# Patient Record
Sex: Female | Born: 1950 | Race: White | Hispanic: No | Marital: Married | Smoking: Never smoker
Health system: Southern US, Community
[De-identification: ages and names within clinical notes are randomized; demographics above are authoritative.]

## PROBLEM LIST (undated history)

## (undated) DIAGNOSIS — C801 Malignant (primary) neoplasm, unspecified: Secondary | ICD-10-CM

## (undated) DIAGNOSIS — R1310 Dysphagia, unspecified: Secondary | ICD-10-CM

## (undated) DIAGNOSIS — N200 Calculus of kidney: Secondary | ICD-10-CM

## (undated) DIAGNOSIS — D0510 Intraductal carcinoma in situ of unspecified breast: Secondary | ICD-10-CM

## (undated) HISTORY — PX: LITHOTRIPSY: SUR834

## (undated) HISTORY — PX: TONSILLECTOMY: SUR1361

## (undated) HISTORY — PX: MASTECTOMY: SHX3

---

## 1990-08-17 HISTORY — PX: BREAST SURGERY: SHX581

## 1998-03-01 ENCOUNTER — Other Ambulatory Visit: Admission: RE | Admit: 1998-03-01 | Discharge: 1998-03-01 | Payer: Self-pay | Admitting: *Deleted

## 1999-06-03 ENCOUNTER — Other Ambulatory Visit: Admission: RE | Admit: 1999-06-03 | Discharge: 1999-06-03 | Payer: Self-pay | Admitting: *Deleted

## 2001-04-07 ENCOUNTER — Other Ambulatory Visit: Admission: RE | Admit: 2001-04-07 | Discharge: 2001-04-07 | Payer: Self-pay | Admitting: *Deleted

## 2003-05-24 ENCOUNTER — Other Ambulatory Visit: Admission: RE | Admit: 2003-05-24 | Discharge: 2003-05-24 | Payer: Self-pay | Admitting: Obstetrics & Gynecology

## 2003-06-14 HISTORY — PX: BREAST RECONSTRUCTION: SHX9

## 2005-04-23 ENCOUNTER — Ambulatory Visit (HOSPITAL_COMMUNITY): Admission: RE | Admit: 2005-04-23 | Discharge: 2005-04-23 | Payer: Self-pay | Admitting: *Deleted

## 2005-10-14 ENCOUNTER — Ambulatory Visit: Payer: Self-pay | Admitting: Unknown Physician Specialty

## 2005-10-14 HISTORY — PX: COLONOSCOPY: SHX174

## 2007-01-02 ENCOUNTER — Emergency Department: Payer: Self-pay | Admitting: Emergency Medicine

## 2014-04-25 DIAGNOSIS — D0510 Intraductal carcinoma in situ of unspecified breast: Secondary | ICD-10-CM

## 2014-04-25 HISTORY — DX: Intraductal carcinoma in situ of unspecified breast: D05.10

## 2015-10-10 ENCOUNTER — Emergency Department
Admission: EM | Admit: 2015-10-10 | Discharge: 2015-10-10 | Disposition: A | Discharge status: Home / Self Care | Payer: Managed Care, Other (non HMO) | Attending: Emergency Medicine | Admitting: Emergency Medicine

## 2015-10-10 ENCOUNTER — Telehealth: Payer: Self-pay | Admitting: Emergency Medicine

## 2015-10-10 DIAGNOSIS — R197 Diarrhea, unspecified: Secondary | ICD-10-CM | POA: Diagnosis not present

## 2015-10-10 DIAGNOSIS — R112 Nausea with vomiting, unspecified: Secondary | ICD-10-CM | POA: Insufficient documentation

## 2015-10-10 LAB — CBC
HCT: 42.3 % (ref 35.0–47.0)
HEMOGLOBIN: 14.5 g/dL (ref 12.0–16.0)
MCH: 32.3 pg (ref 26.0–34.0)
MCHC: 34.2 g/dL (ref 32.0–36.0)
MCV: 94.2 fL (ref 80.0–100.0)
PLATELETS: 198 10*3/uL (ref 150–440)
RBC: 4.49 MIL/uL (ref 3.80–5.20)
RDW: 13 % (ref 11.5–14.5)
WBC: 9.5 10*3/uL (ref 3.6–11.0)

## 2015-10-10 LAB — COMPREHENSIVE METABOLIC PANEL
ALBUMIN: 4.9 g/dL (ref 3.5–5.0)
ALK PHOS: 82 U/L (ref 38–126)
ALT: 19 U/L (ref 14–54)
ANION GAP: 12 (ref 5–15)
AST: 24 U/L (ref 15–41)
BUN: 19 mg/dL (ref 6–20)
CALCIUM: 9.6 mg/dL (ref 8.9–10.3)
CHLORIDE: 105 mmol/L (ref 101–111)
CO2: 23 mmol/L (ref 22–32)
Creatinine, Ser: 0.77 mg/dL (ref 0.44–1.00)
GFR calc non Af Amer: >60 mL/min (ref >60)
GLUCOSE: 174 mg/dL — AB (ref 65–99)
POTASSIUM: 3.9 mmol/L (ref 3.5–5.1)
SODIUM: 140 mmol/L (ref 135–145)
Total Bilirubin: 1.1 mg/dL (ref 0.3–1.2)
Total Protein: 7.8 g/dL (ref 6.5–8.1)

## 2015-10-10 LAB — LIPASE, BLOOD: Lipase: 26 U/L (ref 11–51)

## 2015-10-10 NOTE — ED Notes (Signed)
PT in with co n/v/d since 2300 denies any abd pain.

## 2015-10-10 NOTE — ED Notes (Signed)
Called patient due to lwot to inquire about condition and follow up plans. Left message.   

## 2015-10-15 ENCOUNTER — Other Ambulatory Visit: Payer: Self-pay | Admitting: Nurse Practitioner

## 2015-10-15 DIAGNOSIS — R1310 Dysphagia, unspecified: Secondary | ICD-10-CM

## 2015-10-25 ENCOUNTER — Ambulatory Visit: Payer: Managed Care, Other (non HMO)

## 2015-11-07 ENCOUNTER — Ambulatory Visit
Admission: RE | Admit: 2015-11-07 | Discharge: 2015-11-07 | Disposition: A | Discharge status: Home / Self Care | Payer: Managed Care, Other (non HMO) | Source: Ambulatory Visit | Attending: Nurse Practitioner | Admitting: Nurse Practitioner

## 2015-11-07 DIAGNOSIS — R1310 Dysphagia, unspecified: Secondary | ICD-10-CM | POA: Diagnosis present

## 2015-11-07 NOTE — Therapy (Addendum)
[place], [place], [address] Phone: [phone]   Fax:     Modified Barium Swallow  Patient Details  Name: Laura Lester MRN: [id number] Date of Birth: [birth year] No Data Recorded  Encounter Date: 11/07/2015   Subjective: Patient behavior: (alertness, ability to follow instructions, etc.): pt A/O x3; verbally conversive and pleasant. Noted inconsistent throat clearing prior to study; clear vocal quality and adequate volume of speech.  Chief complaint: Dysphagia. Pt endorsed feelings of difficulty initiating the swallow at times; noted more significant issues w/ meats, some breads. Pt stated she has had this "feeling" for "years"; noted a Barium study in 2006 in which this was noted as well per her chart history. Pt stated she feels it necessary to chew the meats/foods well in order to be able to swallow them. She denies any s/s of Reflux and is not on any PPI per her report.    Objective:  Radiological Procedure: A videoflouroscopic evaluation of oral-preparatory, reflex initiation, and pharyngeal phases of the swallow was performed; as well as a screening of the upper esophageal phase.  I. POSTURE: upright II. VIEW: A-P III. COMPENSATORY STRATEGIES: moistened the cracker IV. BOLUSES ADMINISTERED:  Thin Liquid: 6 trials via cup  Nectar-thick Liquid: n/a  Honey-thick Liquid: n/a  Puree: 3 trials  Mechanical Soft: 3 trials V. RESULTS OF EVALUATION: A. ORAL PREPARATORY PHASE: (The lips, tongue, and velum are observed for strength and coordination)       **Overall Severity Rating: WFL. Pt exhibited appropriate oral control for bolus mastication/management and transfer for swallowing w/ consistencies tested(meats were unable to be assessed during this type of study). Native dentition.   B. SWALLOW INITIATION/REFLEX: (The reflex is normal if "triggered" by the time the bolus reached the base of the  tongue)  **Overall Severity Rating: [facility] Pt exhibited adequate timing of the pharyngeal swallow; trials moved over the base of tongue to the area where the valleculae is located - however, view of the valleculae and epiglottis was obscured d/t what appeared to be a mass effect on the anterior portion of the Hypopharynx.(This was confirmed by the Radiologist)   C. PHARYNGEAL PHASE: (Pharyngeal function is normal if the bolus shows rapid, smooth, and continuous transit through the pharynx and there is no pharyngeal residue after the swallow)  **Overall Severity Rating: [facility]. Pt exhibited fairly adequate pharyngeal phase of swallowing w/ only slight pharyngeal residue occuring moreso in the pyriform sinuses intermittently. This was cleared completely by an involuntary, f/u swallow by pt(no cues). This pharyngeal residue again was slight, and inconsistent; no buildup was noted as trials continued.    D. LARYNGEAL PENETRATION: (Material entering into the laryngeal inlet/vestibule but not aspirated): NONE E. ASPIRATION: NONE F. ESOPHAGEAL PHASE: (Screening of the upper esophagus): Esophageal dysmotility was noted in the mid-lower Esophagus as bolus material(food) traveled distally w/ a min. degree of retrograde activity noted in the mid Esophagus. Time post trial appeared to aid motility/clearing.   ASSESSMENT: Pt presented w/ adequate oropharyngeal phase function of swallowing w/ no laryngeal penetration or aspiration occuring during this study. Oral phase of swallowing was [facility] w/ all trial consistencies. Timing of the pharyngeal swallow was [place] [facility] during the Pharyngeal phase w/ all trial consistencies. Pt exhibited initiation of the pharyngeal swallow as bolus material moved over the base of tongue to the area where the Valleculae is located. However, view of the Valleculae and Epiglottis was obscured by what appeared to be a mass  effect on the Anterior portion of the Hypopharynx. This was confirmed by the  Radiologist present who then recommended a CT scan of this area to better assess. Pt utilized a f/u (independent) swallow during the Pharyngeal phase to clear any slight bolus residue; this was cleared completely w/ no buildup of residue noted. Post study, pt was noted to demonstrate fairly chronic throat clearing; unsure if related to increased sensation(from the suspect mass effect) in the Hypopharynx or any mild Esophageal dysmotility. Pt stated she was not fully aware of the consistency of the throat clearing herself. With any chronic throat clearing, f/u w/ ENT for further assessment is recommended.   PLAN/RECOMMENDATIONS:  A. Diet: regular; cut meats/foods moistened well  B. Swallowing Precautions: general aspiration and Reflux precautions as discussed  C. Recommended consultation to: Primary MD for discussion of CT of head/neck area d/t findings on MBSS and per recommendation of Radiologist; ENT (throat clearing); GI (potential f/u w/ EGD to r/o any Esophageal dysmotility)  D. Therapy recommendations: no f/u w/ skilled ST services indicated at this time  E. Results and recommendations were discussed w/ pt and agreed upon; results faxed to MD.      End of Session - 2015-12-02 1602    Visit Number 1   Number of Visits 1   Date for SLP Re-Evaluation Dec 02, 2015   SLP Start Time 1320   SLP Stop Time  1420   SLP Time Calculation (min) 60 min   Activity Tolerance Patient tolerated treatment well      PMH: Breast Cancer w/ Mastectomy, per pt report. Pt denied any other Neurological history or significant medical history.   There were no vitals filed for this visit.  Visit Diagnosis: Dysphagia - Plan: DG SWALLOWING FUNC-SPEECH PATHOLOGY, DG SWALLOWING FUNC-SPEECH PATHOLOGY                  G-Codes - 02-Dec-2015 12/11/00    Functional Assessment Tool Used clinical judgement   Functional Limitations Swallowing   Swallow Current Status [id number]) At least 1 percent but less than 20 percent  impaired, limited or restricted   Swallow Goal Status [id number]) At least 1 percent but less than 20 percent impaired, limited or restricted   Swallow Discharge Status [id number]) At least 1 percent but less than 20 percent impaired, limited or restricted            Orinda Kenner, [place], CCC-SLP  Chardai Gangemi 12-02-15, 4:04 PM  [place], [place], [address] Phone: [phone]   Fax:     Name: Laura Lester MRN: [id number] Date of Birth: 1951-06-03

## 2015-11-08 ENCOUNTER — Other Ambulatory Visit: Payer: Self-pay | Admitting: Internal Medicine

## 2015-11-08 DIAGNOSIS — J392 Other diseases of pharynx: Secondary | ICD-10-CM

## 2015-11-13 ENCOUNTER — Ambulatory Visit: Payer: Managed Care, Other (non HMO)

## 2015-11-20 ENCOUNTER — Encounter: Payer: Self-pay | Admitting: *Deleted

## 2015-11-21 ENCOUNTER — Ambulatory Visit: Payer: Managed Care, Other (non HMO) | Admitting: Anesthesiology

## 2015-11-21 ENCOUNTER — Encounter
Admission: RE | Disposition: A | Discharge status: Home / Self Care | Payer: Self-pay | Source: Ambulatory Visit | Attending: Unknown Physician Specialty

## 2015-11-21 ENCOUNTER — Ambulatory Visit
Admission: RE | Admit: 2015-11-21 | Discharge: 2015-11-21 | Disposition: A | Discharge status: Home / Self Care | Payer: Managed Care, Other (non HMO) | Source: Ambulatory Visit | Attending: Unknown Physician Specialty | Admitting: Unknown Physician Specialty

## 2015-11-21 ENCOUNTER — Encounter: Payer: Self-pay | Admitting: Anesthesiology

## 2015-11-21 DIAGNOSIS — Z9010 Acquired absence of unspecified breast and nipple: Secondary | ICD-10-CM | POA: Diagnosis not present

## 2015-11-21 DIAGNOSIS — Z8371 Family history of colonic polyps: Secondary | ICD-10-CM | POA: Diagnosis not present

## 2015-11-21 DIAGNOSIS — K640 First degree hemorrhoids: Secondary | ICD-10-CM | POA: Insufficient documentation

## 2015-11-21 DIAGNOSIS — Z9889 Other specified postprocedural states: Secondary | ICD-10-CM | POA: Insufficient documentation

## 2015-11-21 DIAGNOSIS — Z853 Personal history of malignant neoplasm of breast: Secondary | ICD-10-CM | POA: Insufficient documentation

## 2015-11-21 DIAGNOSIS — Z79899 Other long term (current) drug therapy: Secondary | ICD-10-CM | POA: Insufficient documentation

## 2015-11-21 DIAGNOSIS — Z87442 Personal history of urinary calculi: Secondary | ICD-10-CM | POA: Insufficient documentation

## 2015-11-21 DIAGNOSIS — Z880 Allergy status to penicillin: Secondary | ICD-10-CM | POA: Diagnosis not present

## 2015-11-21 DIAGNOSIS — D125 Benign neoplasm of sigmoid colon: Secondary | ICD-10-CM | POA: Diagnosis not present

## 2015-11-21 DIAGNOSIS — Z1211 Encounter for screening for malignant neoplasm of colon: Secondary | ICD-10-CM | POA: Diagnosis present

## 2015-11-21 HISTORY — PX: COLONOSCOPY WITH PROPOFOL: SHX5780

## 2015-11-21 HISTORY — DX: Dysphagia, unspecified: R13.10

## 2015-11-21 HISTORY — DX: Calculus of kidney: N20.0

## 2015-11-21 HISTORY — DX: Intraductal carcinoma in situ of unspecified breast: D05.10

## 2015-11-21 SURGERY — COLONOSCOPY WITH PROPOFOL
Anesthesia: General

## 2015-11-21 MED ORDER — EPHEDRINE SULFATE 50 MG/ML IJ SOLN
INTRAMUSCULAR | Status: DC | PRN
  Administered 2015-11-21: 5 mg via INTRAVENOUS
  Administered 2015-11-21 (×2): 10 mg via INTRAVENOUS

## 2015-11-21 MED ORDER — LIDOCAINE HCL (PF) 2 % IJ SOLN
INTRAMUSCULAR | Status: DC | PRN
  Administered 2015-11-21: 60 mg

## 2015-11-21 MED ORDER — SODIUM CHLORIDE 0.9 % IV SOLN
INTRAVENOUS | Status: DC
  Administered 2015-11-21: 1000 mL via INTRAVENOUS

## 2015-11-21 MED ORDER — SODIUM CHLORIDE 0.9 % IV SOLN: INTRAVENOUS | Status: DC

## 2015-11-21 MED ORDER — PROPOFOL 500 MG/50ML IV EMUL
INTRAVENOUS | Status: DC | PRN
  Administered 2015-11-21: 100 ug/kg/min via INTRAVENOUS

## 2015-11-21 MED ORDER — FENTANYL CITRATE (PF) 100 MCG/2ML IJ SOLN
INTRAMUSCULAR | Status: DC | PRN
  Administered 2015-11-21: 50 ug via INTRAVENOUS

## 2015-11-21 MED ORDER — PROPOFOL 10 MG/ML IV BOLUS
INTRAVENOUS | Status: DC | PRN
  Administered 2015-11-21: 10 mg via INTRAVENOUS
  Administered 2015-11-21: 20 mg via INTRAVENOUS

## 2015-11-21 MED ORDER — MIDAZOLAM HCL 5 MG/5ML IJ SOLN
INTRAMUSCULAR | Status: DC | PRN
  Administered 2015-11-21: 1 mg via INTRAVENOUS

## 2015-11-21 NOTE — Anesthesia Preprocedure Evaluation (Signed)
Anesthesia Evaluation  Patient identified by MRN, date of birth, ID band Patient awake    Reviewed: Allergy & Precautions, H&P , NPO status , Patient's Chart, lab work & pertinent test results, reviewed documented beta blocker date and time   History of Anesthesia Complications Negative for: history of anesthetic complications  Airway Mallampati: II  TM Distance: >3 FB Neck ROM: full    Dental no notable dental hx. (+) Teeth Intact   Pulmonary neg pulmonary ROS,    Pulmonary exam normal breath sounds clear to auscultation       Cardiovascular Exercise Tolerance: Good negative cardio ROS Normal cardiovascular exam Rhythm:regular Rate:Normal     Neuro/Psych negative neurological ROS  negative psych ROS   GI/Hepatic negative GI ROS, Neg liver ROS,   Endo/Other  negative endocrine ROS  Renal/GU Renal disease (kidney stones)  negative genitourinary   Musculoskeletal   Abdominal   Peds  Hematology negative hematology ROS (+)   Anesthesia Other Findings Past Medical History:   Ductal carcinoma in situ (DCIS) of breast       04/25/2014     Dysphagia                                                    Nephrolithiasis                                              Reproductive/Obstetrics negative OB ROS                             Anesthesia Physical Anesthesia Plan  ASA: I  Anesthesia Plan: General   Post-op Pain Management:    Induction:   Airway Management Planned:   Additional Equipment:   Intra-op Plan:   Post-operative Plan:   Informed Consent: I have reviewed the patients History and Physical, chart, labs and discussed the procedure including the risks, benefits and alternatives for the proposed anesthesia with the patient or authorized representative who has indicated his/her understanding and acceptance.   Dental Advisory Given  Plan Discussed with: Anesthesiologist, CRNA  and Surgeon  Anesthesia Plan Comments:         Anesthesia Quick Evaluation

## 2015-11-21 NOTE — Op Note (Signed)
[facility] Gastroenterology Patient Name: Laura Lester Procedure Date: 11/21/2015 10:30 AM MRN: [id number] Account #: [id number] Date of Birth: 12/01/1950 Admit Type: Outpatient Age: 65 Room: [facility] ENDO ROOM 1 Gender: Female Note Status: Finalized Procedure:            Colonoscopy Indications:          Colon cancer screening in patient at increased risk:                        Family history of 1st-degree relative with colon polyps Providers:            Manya Silvas, MD Referring MD:         Rusty Aus, MD (Referring MD) Medicines:            Propofol per Anesthesia Complications:        No immediate complications. Procedure:            Pre-Anesthesia Assessment:                       - After reviewing the risks and benefits, the patient                        was deemed in satisfactory condition to undergo the                        procedure.                       After obtaining informed consent, the colonoscope was                        passed under direct vision. Throughout the procedure,                        the patient's blood pressure, pulse, and oxygen                        saturations were monitored continuously. The                        Colonoscope was introduced through the anus and                        advanced to the the cecum, identified by appendiceal                        orifice and ileocecal valve. The colonoscopy was                        somewhat difficult due to significant looping.                        Successful completion of the procedure was aided by                        applying abdominal pressure. The patient tolerated the                        procedure well. The quality of the bowel preparation  was good. Findings:      A small polyp was found in the descending colon. The polyp was sessile.       The polyp was removed with a hot snare. Resection and retrieval were       complete.  Internal hemorrhoids were found during endoscopy. The hemorrhoids were       small and Grade I (internal hemorrhoids that do not prolapse).      The exam was otherwise without abnormality. Impression:           - One small polyp in the descending colon, removed with                        a hot snare. Resected and retrieved.                       - Internal hemorrhoids.                       - The examination was otherwise normal. Recommendation:       - Await pathology results. Manya Silvas, MD 11/21/2015 11:08:09 AM This report has been signed electronically. Number of Addenda: 0 Note Initiated On: 11/21/2015 10:30 AM Scope Withdrawal Time: 0 hours 12 minutes 27 seconds  Total Procedure Duration: 0 hours 26 minutes 47 seconds       [facility]

## 2015-11-21 NOTE — Anesthesia Postprocedure Evaluation (Signed)
Anesthesia Post Note  Patient: Laura Lester  Procedure(s) Performed: Procedure(s) (LRB): COLONOSCOPY WITH PROPOFOL (N/A)  Patient location during evaluation: Endoscopy Anesthesia Type: General Level of consciousness: awake and alert Pain management: pain level controlled Vital Signs Assessment: post-procedure vital signs reviewed and stable Respiratory status: spontaneous breathing, nonlabored ventilation, respiratory function stable and patient connected to nasal cannula oxygen Cardiovascular status: blood pressure returned to baseline and stable Postop Assessment: no signs of nausea or vomiting Anesthetic complications: no    Last Vitals:  Filed Vitals:   11/21/15 1130 11/21/15 1140  BP: 126/67 141/77  Pulse: 59 67  Temp:    Resp: 19 16    Last Pain: There were no vitals filed for this visit.               Martha Clan

## 2015-11-21 NOTE — H&P (Signed)
   Primary Care Physician:  Rusty Aus, MD Primary Gastroenterologist:  Dr. Vira Agar  Pre-Procedure History & Physical: HPI:  Laura Lester is a 65 y.o. female is here for an colonoscopy.   Past Medical History  Diagnosis Date  . 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 Dysphagia   . Nephrolithiasis     Past Surgical History  Procedure Laterality Date  . Cesarean section    . Breast surgery  1992    Mastectomy  . Lithotripsy    . Colonoscopy  10/14/2005  . Breast reconstruction Right 06/14/2003    Reconstruction breast delayed post mastopexy w/implant    Prior to Admission medications   Medication Sig Start Date End Date Taking? Authorizing Provider  cloNIDine (CATAPRES) 0.1 MG tablet Take 0.1 mg by mouth at bedtime as needed (Used for difficulty staying asleep).   Yes Historical Provider, MD  valACYclovir (VALTREX) 1000 MG tablet Take 1,000 mg by mouth 2 (two) times daily. Reported on 11/21/2015    Historical Provider, MD    Allergies as of 11/14/2015 - Review Complete 10/10/2015  Allergen Reaction Noted  . Penicillins Hives 10/10/2015    History reviewed. No pertinent family history.  Social History   Social History  . Marital Status: Married    Spouse Name: N/A  . Number of Children: N/A  . Years of Education: N/A   Occupational History  . Not on file.   Social History Main Topics  . Smoking status: Never Smoker   . Smokeless tobacco: Not on file  . Alcohol Use: No  . Drug Use: No  . Sexual Activity: Not on file   Other Topics Concern  . Not on file   Social History Narrative    Review of Systems: See HPI, otherwise negative ROS  Physical Exam: BP 127/70 mmHg  Pulse 63  Temp(Src) 96.6 F (35.9 C) (Tympanic)  Resp 16  Ht 5' 6.5" (1.689 m)  Wt 63.504 kg (140 lb)  BMI 22.26 kg/m2  SpO2 100% General:   Alert,  pleasant and cooperative in NAD Head:  Normocephalic and atraumatic. Neck:  Supple; no masses or thyromegaly. Lungs:   Clear throughout to auscultation.    Heart:  Regular rate and rhythm. Abdomen:  Soft, nontender and nondistended. Normal bowel sounds, without guarding, and without rebound.   Neurologic:  Alert and  oriented x4;  grossly normal neurologically.  Impression/Plan: [name] is here for an colonoscopy to be performed for FH colon polyps  Risks, benefits, limitations, and alternatives regarding  colonoscopy have been reviewed with the patient.  Questions have been answered.  All parties agreeable.   Gaylyn Cheers, MD  11/21/2015, 10:30 AM

## 2015-11-21 NOTE — Transfer of Care (Signed)
Immediate Anesthesia Transfer of Care Note  Patient: Laura Lester  Procedure(s) Performed: Procedure(s): COLONOSCOPY WITH PROPOFOL (N/A)  Patient Location: PACU  Anesthesia Type:General  Level of Consciousness: sedated  Airway & Oxygen Therapy: Patient Spontanous Breathing and Patient connected to nasal cannula oxygen  Post-op Assessment: Report given to RN and Post -op Vital signs reviewed and stable  Post vital signs: Reviewed and stable  Last Vitals:  Filed Vitals:   11/21/15 0952  BP: 127/70  Pulse: 63  Temp: 35.9 C  Resp: 16    Complications: No apparent anesthesia complications

## 2015-11-22 LAB — SURGICAL PATHOLOGY

## 2015-11-26 ENCOUNTER — Encounter: Payer: Self-pay | Admitting: Unknown Physician Specialty

## 2015-12-10 ENCOUNTER — Encounter: Payer: Self-pay | Admitting: *Deleted

## 2015-12-11 ENCOUNTER — Ambulatory Visit: Payer: Managed Care, Other (non HMO) | Admitting: Certified Registered Nurse Anesthetist

## 2015-12-11 ENCOUNTER — Encounter
Admission: RE | Disposition: A | Discharge status: Home / Self Care | Payer: Self-pay | Source: Ambulatory Visit | Attending: Unknown Physician Specialty

## 2015-12-11 ENCOUNTER — Ambulatory Visit
Admission: RE | Admit: 2015-12-11 | Discharge: 2015-12-11 | Disposition: A | Discharge status: Home / Self Care | Payer: Managed Care, Other (non HMO) | Source: Ambulatory Visit | Attending: Unknown Physician Specialty | Admitting: Unknown Physician Specialty

## 2015-12-11 ENCOUNTER — Encounter: Payer: Self-pay | Admitting: *Deleted

## 2015-12-11 DIAGNOSIS — Z79899 Other long term (current) drug therapy: Secondary | ICD-10-CM | POA: Diagnosis not present

## 2015-12-11 DIAGNOSIS — Z87442 Personal history of urinary calculi: Secondary | ICD-10-CM | POA: Diagnosis not present

## 2015-12-11 DIAGNOSIS — Z9889 Other specified postprocedural states: Secondary | ICD-10-CM | POA: Diagnosis not present

## 2015-12-11 DIAGNOSIS — Z880 Allergy status to penicillin: Secondary | ICD-10-CM | POA: Diagnosis not present

## 2015-12-11 DIAGNOSIS — R1310 Dysphagia, unspecified: Secondary | ICD-10-CM | POA: Insufficient documentation

## 2015-12-11 DIAGNOSIS — K2970 Gastritis, unspecified, without bleeding: Secondary | ICD-10-CM | POA: Diagnosis not present

## 2015-12-11 DIAGNOSIS — Z853 Personal history of malignant neoplasm of breast: Secondary | ICD-10-CM | POA: Diagnosis not present

## 2015-12-11 DIAGNOSIS — J392 Other diseases of pharynx: Secondary | ICD-10-CM | POA: Diagnosis present

## 2015-12-11 DIAGNOSIS — Z9010 Acquired absence of unspecified breast and nipple: Secondary | ICD-10-CM | POA: Diagnosis not present

## 2015-12-11 HISTORY — PX: ESOPHAGOGASTRODUODENOSCOPY (EGD) WITH PROPOFOL: SHX5813

## 2015-12-11 HISTORY — PX: BALLOON DILATION: SHX5330

## 2015-12-11 HISTORY — DX: Malignant (primary) neoplasm, unspecified: C80.1

## 2015-12-11 SURGERY — ESOPHAGOGASTRODUODENOSCOPY (EGD) WITH PROPOFOL
Anesthesia: General

## 2015-12-11 MED ORDER — GLYCOPYRROLATE 0.2 MG/ML IJ SOLN
INTRAMUSCULAR | Status: DC | PRN
  Administered 2015-12-11: 0.2 mg via INTRAVENOUS

## 2015-12-11 MED ORDER — PROPOFOL 10 MG/ML IV BOLUS
INTRAVENOUS | Status: DC | PRN
  Administered 2015-12-11: 20 mg via INTRAVENOUS
  Administered 2015-12-11: 30 mg via INTRAVENOUS
  Administered 2015-12-11: 50 mg via INTRAVENOUS

## 2015-12-11 MED ORDER — LIDOCAINE HCL (CARDIAC) 20 MG/ML IV SOLN
INTRAVENOUS | Status: DC | PRN
  Administered 2015-12-11: 100 mg via INTRAVENOUS

## 2015-12-11 MED ORDER — BUTAMBEN-TETRACAINE-BENZOCAINE 2-2-14 % EX AERO
INHALATION_SPRAY | CUTANEOUS | Status: DC | PRN
  Administered 2015-12-11: 1 via TOPICAL

## 2015-12-11 MED ORDER — FENTANYL CITRATE (PF) 100 MCG/2ML IJ SOLN
INTRAMUSCULAR | Status: DC | PRN
Start: 2015-12-11 — End: 2015-12-11
  Administered 2015-12-11: 50 ug via INTRAVENOUS

## 2015-12-11 MED ORDER — PROPOFOL 500 MG/50ML IV EMUL
INTRAVENOUS | Status: DC | PRN
  Administered 2015-12-11: 140 ug/kg/min via INTRAVENOUS

## 2015-12-11 MED ORDER — SODIUM CHLORIDE 0.9 % IV SOLN
INTRAVENOUS | Status: DC
  Administered 2015-12-11: 1000 mL via INTRAVENOUS

## 2015-12-11 MED ORDER — SODIUM CHLORIDE 0.9 % IV SOLN: INTRAVENOUS | Status: DC

## 2015-12-11 NOTE — Anesthesia Procedure Notes (Signed)
Performed by: Jacy Brocker Pre-anesthesia Checklist: Emergency Drugs available, Patient identified, Suction available, Patient being monitored and Timeout performed Patient Re-evaluated:Patient Re-evaluated prior to inductionOxygen Delivery Method: Nasal cannula Intubation Type: IV induction       

## 2015-12-11 NOTE — Anesthesia Preprocedure Evaluation (Signed)
Anesthesia Evaluation  Patient identified by MRN, date of birth, ID band Patient awake    Reviewed: Allergy & Precautions, NPO status , Patient's Chart, lab work & pertinent test results, reviewed documented beta blocker date and time   Airway Mallampati: II  TM Distance: >3 FB     Dental  (+) Chipped   Pulmonary          Cardiovascular hypertension, Pt. on medications     Neuro/Psych    GI/Hepatic   Endo/Other    Renal/GU Renal InsufficiencyRenal disease     Musculoskeletal   Abdominal   Peds  Hematology   Anesthesia Other Findings   Reproductive/Obstetrics                             Anesthesia Physical Anesthesia Plan  ASA: II  Anesthesia Plan: General   Post-op Pain Management:    Induction: Intravenous  Airway Management Planned: Nasal Cannula  Additional Equipment:   Intra-op Plan:   Post-operative Plan:   Informed Consent: I have reviewed the patients History and Physical, chart, labs and discussed the procedure including the risks, benefits and alternatives for the proposed anesthesia with the patient or authorized representative who has indicated his/her understanding and acceptance.     Plan Discussed with: CRNA  Anesthesia Plan Comments:         Anesthesia Quick Evaluation  

## 2015-12-11 NOTE — H&P (Signed)
   Primary Care Physician:  Rusty Aus, MD Primary Gastroenterologist:  Dr. Vira Agar  Pre-Procedure History & Physical: HPI:  Laura Lester is a 65 y.o. female is here for an endoscopy.   Past Medical History  Diagnosis Date  . 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 Dysphagia   . Cancer ([place])     Ductal carcinoma in situ of breast  . Nephrolithiasis   . Nephrolithiasis     Past Surgical History  Procedure Laterality Date  . Cesarean section    . Breast surgery  1992    Mastectomy  . Lithotripsy    . Colonoscopy  10/14/2005  . Breast reconstruction Right 06/14/2003    Reconstruction breast delayed post mastopexy w/implant  . Colonoscopy with propofol N/A 11/21/2015    Procedure: COLONOSCOPY WITH PROPOFOL;  Surgeon: Manya Silvas, MD;  Location: [facility] ENDOSCOPY;  Service: Endoscopy;  Laterality: N/A;  . Mastectomy    . Tonsillectomy      Prior to Admission medications   Medication Sig Start Date End Date Taking? Authorizing Provider  cloNIDine (CATAPRES) 0.1 MG tablet Take 0.1 mg by mouth at bedtime as needed (Used for difficulty staying asleep).    Historical Provider, MD  valACYclovir (VALTREX) 1000 MG tablet Take 1,000 mg by mouth 2 (two) times daily. Reported on 11/21/2015    Historical Provider, MD    Allergies as of 12/03/2015 - Review Complete 11/21/2015  Allergen Reaction Noted  . Penicillin v Hives 11/20/2015  . Penicillins Hives 10/10/2015    History reviewed. No pertinent family history.  Social History   Social History  . Marital Status: Married    Spouse Name: N/A  . Number of Children: N/A  . Years of Education: N/A   Occupational History  . Not on file.   Social History Main Topics  . Smoking status: Never Smoker   . Smokeless tobacco: Never Used  . Alcohol Use: No  . Drug Use: No  . Sexual Activity: Not on file   Other Topics Concern  . Not on file   Social History Narrative    Review of Systems: See HPI, otherwise  negative ROS  Physical Exam: BP 137/76 mmHg  Pulse 55  Temp(Src) 97.7 F (36.5 C) (Tympanic)  Resp 18  SpO2 100% General:   Alert,  pleasant and cooperative in NAD Head:  Normocephalic and atraumatic. Neck:  Supple; no masses or thyromegaly. Lungs:  Clear throughout to auscultation.    Heart:  Regular rate and rhythm. Abdomen:  Soft, nontender and nondistended. Normal bowel sounds, without guarding, and without rebound.   Neurologic:  Alert and  oriented x4;  grossly normal neurologically.  Impression/Plan: Laura Lester is here for an endoscopy to be performed for dysphagia  Risks, benefits, limitations, and alternatives regarding  endoscopy have been reviewed with the patient.  Questions have been answered.  All parties agreeable.   Gaylyn Cheers, MD  12/11/2015, 10:39 AM

## 2015-12-11 NOTE — Anesthesia Postprocedure Evaluation (Signed)
Anesthesia Post Note  Patient: Laura Lester  Procedure(s) Performed: Procedure(s) (LRB): ESOPHAGOGASTRODUODENOSCOPY (EGD) WITH PROPOFOL (N/A) BALLOON DILATION (N/A)  Patient location during evaluation: Endoscopy Anesthesia Type: General Level of consciousness: awake and alert Pain management: pain level controlled Vital Signs Assessment: post-procedure vital signs reviewed and stable Respiratory status: spontaneous breathing, nonlabored ventilation, respiratory function stable and patient connected to nasal cannula oxygen Cardiovascular status: blood pressure returned to baseline and stable Postop Assessment: no signs of nausea or vomiting Anesthetic complications: no    Last Vitals:  Filed Vitals:   12/11/15 1119 12/11/15 1129  BP: 118/95 134/73  Pulse: 59 65  Temp:    Resp: 25 16    Last Pain: There were no vitals filed for this visit.               Marshon Bangs S

## 2015-12-11 NOTE — Transfer of Care (Signed)
Immediate Anesthesia Transfer of Care Note  Patient: Laura Lester  Procedure(s) Performed: Procedure(s): ESOPHAGOGASTRODUODENOSCOPY (EGD) WITH PROPOFOL (N/A) BALLOON DILATION (N/A)  Patient Location: PACU  Anesthesia Type:General  Level of Consciousness: sedated  Airway & Oxygen Therapy: Patient Spontanous Breathing and Patient connected to nasal cannula oxygen  Post-op Assessment: Report given to RN and Post -op Vital signs reviewed and stable  Post vital signs: Reviewed and stable  Last Vitals:  Filed Vitals:   12/11/15 0929  BP: 137/76  Pulse: 55  Temp: 36.5 C  Resp: 18    Last Pain: There were no vitals filed for this visit.       Complications: No apparent anesthesia complications

## 2015-12-11 NOTE — Op Note (Signed)
[facility] Gastroenterology Patient Name: Laura Lester Procedure Date: 12/11/2015 10:42 AM MRN: [id number] Account #: [id number] Date of Birth: Jan 21, 1951 Admit Type: Outpatient Age: 65 Room: [facility] ENDO ROOM 4 Gender: Female Note Status: Finalized Procedure:            Upper GI endoscopy Indications:          Dysphagia Providers:            Manya Silvas, MD Referring MD:         Rusty Aus, MD (Referring MD) Medicines:            Propofol per Anesthesia, Cetacaine spray Complications:        No immediate complications. Procedure:            Pre-Anesthesia Assessment:                       - After reviewing the risks and benefits, the patient                        was deemed in satisfactory condition to undergo the                        procedure.                       After obtaining informed consent, the endoscope was                        passed under direct vision. Throughout the procedure,                        the patient's blood pressure, pulse, and oxygen                        saturations were monitored continuously. The Endoscope                        was introduced through the mouth, and advanced to the                        second part of duodenum. The upper GI endoscopy was                        accomplished without difficulty. The patient tolerated                        the procedure well. Findings:      The examined esophagus was normal. GEJ 43cm.      at the end of tthe procedure a guidewire was placed and the scope was       withdrawn. Dilation was performed with a Savary dilator with no       resistance at 17 mm.      Small round smooth tissue prominences seen on left side in hypopharynx       and swollen lymphatic tissue seen near tip of epiglotis and photographed.      Localized minimal inflammation characterized by erythema and granularity       was found on the greater curvature of the stomach. Biopsies were taken   with a cold forceps for histology. Stomach otherwise normal.      The  examined duodenum was normal. Impression:           - Normal esophagus. Dilated.                       - Small round tissue prominences seen on left side in                        valecula and photographed.                       - Gastritis. Biopsied.                       - Normal examined duodenum. Recommendation:       - Await pathology results. Recommend ENT consult Manya Silvas, MD 12/11/2015 11:20:34 AM This report has been signed electronically. Number of Addenda: 0 Note Initiated On: 12/11/2015 10:42 AM      [facility]

## 2015-12-12 ENCOUNTER — Encounter: Payer: Self-pay | Admitting: Unknown Physician Specialty

## 2015-12-12 LAB — SURGICAL PATHOLOGY

## 2016-03-05 ENCOUNTER — Observation Stay (HOSPITAL_COMMUNITY)
Admission: EM | Admit: 2016-03-05 | Discharge: 2016-03-06 | Disposition: A | Discharge status: Home / Self Care | Payer: Managed Care, Other (non HMO) | Attending: Internal Medicine | Admitting: Internal Medicine

## 2016-03-05 ENCOUNTER — Emergency Department (HOSPITAL_COMMUNITY): Payer: Managed Care, Other (non HMO)

## 2016-03-05 ENCOUNTER — Encounter (HOSPITAL_COMMUNITY): Payer: Self-pay | Admitting: Emergency Medicine

## 2016-03-05 DIAGNOSIS — R0602 Shortness of breath: Secondary | ICD-10-CM | POA: Diagnosis not present

## 2016-03-05 DIAGNOSIS — R001 Bradycardia, unspecified: Secondary | ICD-10-CM | POA: Diagnosis present

## 2016-03-05 DIAGNOSIS — Z853 Personal history of malignant neoplasm of breast: Secondary | ICD-10-CM | POA: Diagnosis not present

## 2016-03-05 DIAGNOSIS — I959 Hypotension, unspecified: Secondary | ICD-10-CM | POA: Diagnosis present

## 2016-03-05 DIAGNOSIS — R55 Syncope and collapse: Secondary | ICD-10-CM | POA: Diagnosis present

## 2016-03-05 DIAGNOSIS — E876 Hypokalemia: Secondary | ICD-10-CM | POA: Diagnosis not present

## 2016-03-05 DIAGNOSIS — Z880 Allergy status to penicillin: Secondary | ICD-10-CM | POA: Diagnosis not present

## 2016-03-05 DIAGNOSIS — I491 Atrial premature depolarization: Secondary | ICD-10-CM | POA: Insufficient documentation

## 2016-03-05 DIAGNOSIS — R1310 Dysphagia, unspecified: Secondary | ICD-10-CM | POA: Diagnosis not present

## 2016-03-05 DIAGNOSIS — R031 Nonspecific low blood-pressure reading: Secondary | ICD-10-CM

## 2016-03-05 DIAGNOSIS — I088 Other rheumatic multiple valve diseases: Secondary | ICD-10-CM | POA: Diagnosis not present

## 2016-03-05 LAB — CBC
HCT: 39 % (ref 36.0–46.0)
HEMOGLOBIN: 13 g/dL (ref 12.0–15.0)
MCH: 31.2 pg (ref 26.0–34.0)
MCHC: 33.3 g/dL (ref 30.0–36.0)
MCV: 93.5 fL (ref 78.0–100.0)
PLATELETS: 186 10*3/uL (ref 150–400)
RBC: 4.17 MIL/uL (ref 3.87–5.11)
RDW: 12.6 % (ref 11.5–15.5)
WBC: 4 10*3/uL (ref 4.0–10.5)

## 2016-03-05 LAB — I-STAT TROPONIN, ED: TROPONIN I, POC: 0.01 ng/mL (ref 0.00–0.08)

## 2016-03-05 LAB — URINALYSIS, ROUTINE W REFLEX MICROSCOPIC
BILIRUBIN URINE: NEGATIVE
Glucose, UA: NEGATIVE mg/dL
Hgb urine dipstick: NEGATIVE
Ketones, ur: 15 mg/dL — AB
Leukocytes, UA: NEGATIVE
NITRITE: NEGATIVE
PROTEIN: NEGATIVE mg/dL
SPECIFIC GRAVITY, URINE: 1.023 (ref 1.005–1.030)
pH: 6 (ref 5.0–8.0)

## 2016-03-05 LAB — MAGNESIUM: MAGNESIUM: 1.8 mg/dL (ref 1.7–2.4)

## 2016-03-05 LAB — CBG MONITORING, ED: GLUCOSE-CAPILLARY: 124 mg/dL — AB (ref 65–99)

## 2016-03-05 LAB — BASIC METABOLIC PANEL
ANION GAP: 6 (ref 5–15)
BUN: 14 mg/dL (ref 6–20)
CALCIUM: 9 mg/dL (ref 8.9–10.3)
CO2: 24 mmol/L (ref 22–32)
Chloride: 110 mmol/L (ref 101–111)
Creatinine, Ser: 0.88 mg/dL (ref 0.44–1.00)
Glucose, Bld: 124 mg/dL — ABNORMAL HIGH (ref 65–99)
Potassium: 3.2 mmol/L — ABNORMAL LOW (ref 3.5–5.1)
Sodium: 140 mmol/L (ref 135–145)

## 2016-03-05 LAB — D-DIMER, QUANTITATIVE: D-Dimer, Quant: 0.32 ug/mL-FEU (ref 0.00–0.50)

## 2016-03-05 MED ORDER — POTASSIUM CHLORIDE CRYS ER 20 MEQ PO TBCR
40.0000 meq | EXTENDED_RELEASE_TABLET | Freq: Once | ORAL | Status: AC
  Administered 2016-03-05: 40 meq via ORAL
  Filled 2016-03-05: qty 2

## 2016-03-05 NOTE — Discharge Instructions (Signed)

## 2016-03-05 NOTE — ED Notes (Signed)
Patient tolerated orthostatic VS well, denied dizziness, nausea, diaphoresis upon sitting and standing

## 2016-03-05 NOTE — ED Provider Notes (Signed)
CSN: [id number]     Arrival date & time 03/05/16  1852 History   First MD Initiated Contact with Patient 03/05/16 1916     Chief Complaint  Patient presents with  . Loss of Consciousness    HPI Comments: 65 year old female presents with syncopal episode. PMH significant for remote hx of breast cancer. Patient states that she was driving today with her husband and had an acute onset of lightheadedness and feeling like she was going to pass out. She pulled over to the side of the road and per her husband passed out for about 1 minute. He reports she was posturing. No tongue biting or incontinence. She became pale, diaphoretic, and SOB. When she regained consciousness EMS was called and vitals obtained revealed she was bradycardic and hypotensive. The gave her a bolus of fluids and she was transported here to the ED for further evaluation. Currently she is reporting feeling fatigued but otherwise denies any other symptoms or pain. She reports SOB has resolved. Denies fever, chills, HA, chest pain, palpitations, abdominal pain, N/V/D, dysuria. She also states the only medicine she takes is Clonidine for sleep prn however she states she has not taken this med in several weeks.   Past Medical History  Diagnosis Date  . 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 Dysphagia   . Cancer ([place])     Ductal carcinoma in situ of breast  . Nephrolithiasis   . Nephrolithiasis    Past Surgical History  Procedure Laterality Date  . Cesarean section    . Breast surgery  1992    Mastectomy  . Lithotripsy    . Colonoscopy  10/14/2005  . Breast reconstruction Right 06/14/2003    Reconstruction breast delayed post mastopexy w/implant  . Colonoscopy with propofol N/A 11/21/2015    Procedure: COLONOSCOPY WITH PROPOFOL;  Surgeon: Manya Silvas, MD;  Location: [facility] ENDOSCOPY;  Service: Endoscopy;  Laterality: N/A;  . Mastectomy    . Tonsillectomy    . Esophagogastroduodenoscopy (egd) with propofol N/A  12/11/2015    Procedure: ESOPHAGOGASTRODUODENOSCOPY (EGD) WITH PROPOFOL;  Surgeon: Manya Silvas, MD;  Location: [place];  Service: Endoscopy;  Laterality: N/A;  . Balloon dilation N/A 12/11/2015    Procedure: BALLOON DILATION;  Surgeon: Manya Silvas, MD;  Location: [facility] ENDOSCOPY;  Service: Endoscopy;  Laterality: N/A;   No family history on file. Social History  Substance Use Topics  . Smoking status: Never Smoker   . Smokeless tobacco: Never Used  . Alcohol Use: No   OB History    No data available     Review of Systems  Constitutional: Positive for fatigue. Negative for fever and chills.  Respiratory: Negative for shortness of breath.   Cardiovascular: Negative for chest pain.  Gastrointestinal: Negative for nausea, vomiting, abdominal pain and diarrhea.  Genitourinary: Negative for dysuria.  Neurological: Positive for syncope. Negative for headaches.  All other systems reviewed and are negative.   Allergies  Penicillin v and Penicillins  Home Medications   Prior to Admission medications   Medication Sig Start Date End Date Taking? Authorizing Provider  cloNIDine (CATAPRES) 0.1 MG tablet Take 0.1 mg by mouth at bedtime as needed (Used for difficulty staying asleep).    Historical Provider, MD  valACYclovir (VALTREX) 1000 MG tablet Take 1,000 mg by mouth 2 (two) times daily. Reported on 11/21/2015    Historical Provider, MD   BP 130/74 mmHg  Pulse 51  Temp(Src) 97.5 F (36.4 C) (  Oral)  Resp 14  Ht 5' 6.5" (1.689 m)  Wt 63.504 kg  BMI 22.26 kg/m2  SpO2 100%   Physical Exam  Constitutional: She is oriented to person, place, and time. She appears well-developed and well-nourished. No distress.  HENT:  Head: Normocephalic and atraumatic.  Eyes: Conjunctivae are normal. Pupils are equal, round, and reactive to light. Right eye exhibits no discharge. Left eye exhibits no discharge. No scleral icterus.  Neck: Normal range of motion.  Cardiovascular: Normal  rate and regular rhythm.  Exam reveals no gallop and no friction rub.   No murmur heard. Pulmonary/Chest: Effort normal and breath sounds normal. No respiratory distress. She has no wheezes. She has no rales. She exhibits no tenderness.  Abdominal: Bowel sounds are normal. She exhibits no distension and no mass. There is no tenderness. There is no rebound and no guarding.  Neurological: She is alert and oriented to person, place, and time. No cranial nerve deficit. She exhibits normal muscle tone. Coordination normal.  Skin: Skin is warm and dry. She is not diaphoretic.  Psychiatric: She has a normal mood and affect. Her behavior is normal. Judgment and thought content normal.    ED Course  Procedures (including critical care time) Labs Review Labs Reviewed  BASIC METABOLIC PANEL - Abnormal; Notable for the following:    Potassium 3.2 (*)    Glucose, Bld 124 (*)    All other components within normal limits  CBG MONITORING, ED - Abnormal; Notable for the following:    Glucose-Capillary 124 (*)    All other components within normal limits  CBC  D-DIMER, QUANTITATIVE (NOT AT [facility])  URINALYSIS, ROUTINE W REFLEX MICROSCOPIC (NOT AT [facility])  Randolm Idol, ED    Imaging Review Dg Chest 2 View  03/05/2016  CLINICAL DATA:  Syncopal episode, shortness of breath, diaphoresis, dizziness EXAM: CHEST  2 VIEW COMPARISON:  None available FINDINGS: Postop changes of the right breast and axilla. Normal heart size and vascularity. Lungs remain clear. No focal pneumonia, collapse or consolidation. No edema, effusion or pneumothorax. Trachea is midline. IMPRESSION: Postop changes as above.  No acute chest process. Electronically Signed   By: Jerilynn Mages.  Shick M.D.   On: 03/05/2016 21:27   I have personally reviewed and evaluated these images and lab results as part of my medical decision-making.   EKG Interpretation   Date/Time:  Thursday March 05 2016 20:54:40 EDT Ventricular Rate:  70 PR Interval:    QRS  Duration: 96 QT Interval:  446 QTC Calculation: 482 R Axis:   77 Text Interpretation:  Sinus rhythm Atrial premature complexes Confirmed by  BELFI  MD, MELANIE ([id number]) on 03/05/2016 9:01:56 PM      MDM   Final diagnoses:  Syncope, unspecified syncope type   65 year old female presents with a syncopal episode. She was hypotensive on arrival and bradycardic which has resolved after several rechecks. Orthostatics are negative. CBC unremarkable. BMP remarkable for mild hypokalemia. 64mEq K given. Glucose is 124. D-dimer negative. Troponin negative. Initial EKG shows sinus brady and repeat shows NSR with PACs. Discussed case with Dr. Tamera Punt. Recommend observation overnight due to unexplained symptomatic bradycardia and hypotension. Spoke with Dr. Harvest Forest who will admit patient.   Recardo Evangelist, PA-C 03/05/16 [place], MD 03/05/16 [phone]

## 2016-03-05 NOTE — ED Notes (Signed)
Pt driving when she became diaphoretic and pale husband reports syncopal episodes. Hypotension and brady in the 21s with [facility]. NS bolus of 125 bp 120/80 While waiting for bed bp dropped 60/40.

## 2016-03-05 NOTE — ED Notes (Signed)
PA-C at bedside 

## 2016-03-05 NOTE — H&P (Signed)
History and Physical    Laura Lester ZMO:[id number] DOB: 06-24-1951 DOA: 03/05/2016  Referring MD/NP/PA: Janetta Hora, PA-C PCP: Rusty Aus, MD  Patient coming from: Home  Chief Complaint: Syncope  HPI: Laura Lester is a 65 y.o. female with medical history significant of breast cancer s/p mastectomy, dysphagia, and nephrolithiasis; who presents after having a syncopal episode. Patient this afternoon was driving home from [place] with her husband when she suddenly felt faint and started to have blurry vision. She is able to pull over, stop the car, and lean back the seat before completely passing out. Just before patient notes that she felt lightheaded. Husband notes that she was out for 1-3 minutes. There is no note of any seizure-like activity, loss of bowel or bladder, or tongue biting. When she regained consciousness she states that she was drenched in sweat and had  15-20 minutes of labored breathing. She states that she's been eating and drinking like normal and even took a nap prior to going to driving to [place] today. No previous cardiac history to her knowledge. Denies any significant social stressors except for her husband's recent eyelid surgery. Patient denies any recent trips or prolonged periods of sitting. She has had adequate follow-up and reports that her last mammogram was in January of this year and she is up-to-date on colonoscopies. She currently does not take any medications except for as needed clonidine to help with sleep, but she has not taken this medication in over 60 days. Patient notes that she's never had similar symptoms like this in the past. Upon EMS arrival patient was noted have heart rates in the 40s with a blood pressure of 80/50. She denies having any recent sick contacts, chest pain, abdominal pain, nausea, vomiting, diarrhea, or dysuria.   Furthermore, patient discusses recently having problems with dysphagia for which she was evaluated by GI and seen to have  cystic lesions for which she was referred back to ENT for further evaluation. Patient has yet to follow-up with ENT at this time.  ED Course: Upon admission patient was evaluated and seen to be afebrile with vital signs within normal limits. Lab work revealed a normal CBC, potassium 3.2, d-dimer 0.32, and other lab work within normal limits. Urinalysis was negative. Chest x-ray showed postop changes from previous mastectomy without any acute chest process noted. Patient was given 40 mg of potassium chloride and TRH called to admit. Patient notes that after that episode she still notes feeling somewhat fatigued and drained.  Review of Systems: As per HPI otherwise 10 point review of systems negative.   Past Medical History  Diagnosis Date  . 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 Dysphagia   . Cancer ([place])     Ductal carcinoma in situ of breast  . Nephrolithiasis   . Nephrolithiasis     Past Surgical History  Procedure Laterality Date  . Cesarean section    . Breast surgery  1992    Mastectomy  . Lithotripsy    . Colonoscopy  10/14/2005  . Breast reconstruction Right 06/14/2003    Reconstruction breast delayed post mastopexy w/implant  . Colonoscopy with propofol N/A 11/21/2015    Procedure: COLONOSCOPY WITH PROPOFOL;  Surgeon: Manya Silvas, MD;  Location: [facility] ENDOSCOPY;  Service: Endoscopy;  Laterality: N/A;  . Mastectomy    . Tonsillectomy    . Esophagogastroduodenoscopy (egd) with propofol N/A 12/11/2015    Procedure: ESOPHAGOGASTRODUODENOSCOPY (EGD) WITH PROPOFOL;  Surgeon: Manya Silvas,  MD;  Location: [facility] ENDOSCOPY;  Service: Endoscopy;  Laterality: N/A;  . Balloon dilation N/A 12/11/2015    Procedure: BALLOON DILATION;  Surgeon: Manya Silvas, MD;  Location: [facility] ENDOSCOPY;  Service: Endoscopy;  Laterality: N/A;     reports that she has never smoked. She has never used smokeless tobacco. She reports that she does not drink alcohol or use illicit  drugs.  Allergies  Allergen Reactions  . Penicillins Hives    No family history on file.  Prior to Admission medications   Medication Sig Start Date End Date Taking? Authorizing Provider  cloNIDine (CATAPRES) 0.1 MG tablet Take 0.1 mg by mouth at bedtime as needed (Used for difficulty staying asleep).   Yes Historical Provider, MD    Physical Exam:  Constitutional:Older female in NAD, calm, comfortable Filed Vitals:   03/05/16 2145 03/05/16 2200 03/05/16 2215 03/05/16 2230  BP: 115/75 139/76 115/71 117/73  Pulse: 73 79 65 65  Temp:      TempSrc:      Resp: 19 18 18 19   Height:      Weight:      SpO2: 97% 95% 96% 96%   Eyes: PERRL, lids and conjunctivae normal ENMT: Mucous membranes are moist. Posterior pharynx clear of any exudate or lesions.Normal dentition.  Neck: normal, supple, no masses, no thyromegaly Respiratory: clear to auscultation bilaterally, no wheezing, no crackles. Normal respiratory effort. No accessory muscle use.  Cardiovascular: Sinus bradycardia, no murmurs / rubs / gallops. No extremity edema. 2+ pedal pulses. No carotid bruits.  Abdomen: no tenderness, no masses palpated. No hepatosplenomegaly. Bowel sounds positive.  Musculoskeletal: no clubbing / cyanosis. No joint deformity upper and lower extremities. Good ROM, no contractures. Normal muscle tone.  Skin: no rashes, lesions, ulcers. No induration Neurologic: CN 2-12 grossly intact. Sensation intact, DTR normal. Strength 5/5 in all 4.  Psychiatric: Normal judgment and insight. Alert and oriented x 3. Normal mood.     Labs on Admission: I have personally reviewed following labs and imaging studies  CBC:  Recent Labs Lab 03/05/16 1925  WBC 4.0  HGB 13.0  HCT 39.0  MCV 93.5  PLT 956   Basic Metabolic Panel:  Recent Labs Lab 03/05/16 1925  NA 140  K 3.2*  CL 110  CO2 24  GLUCOSE 124*  BUN 14  CREATININE 0.88  CALCIUM 9.0   GFR: Estimated Creatinine Clearance: 61.7 mL/min (by C-G  formula based on Cr of 0.88). Liver Function Tests: No results for input(s): AST, ALT, ALKPHOS, BILITOT, PROT, ALBUMIN in the last 168 hours. No results for input(s): LIPASE, AMYLASE in the last 168 hours. No results for input(s): AMMONIA in the last 168 hours. Coagulation Profile: No results for input(s): INR, PROTIME in the last 168 hours. Cardiac Enzymes: No results for input(s): CKTOTAL, CKMB, CKMBINDEX, TROPONINI in the last 168 hours. BNP (last 3 results) No results for input(s): PROBNP in the last 8760 hours. HbA1C: No results for input(s): HGBA1C in the last 72 hours. CBG:  Recent Labs Lab 03/05/16 1927  GLUCAP 124*   Lipid Profile: No results for input(s): CHOL, HDL, LDLCALC, TRIG, CHOLHDL, LDLDIRECT in the last 72 hours. Thyroid Function Tests: No results for input(s): TSH, T4TOTAL, FREET4, T3FREE, THYROIDAB in the last 72 hours. Anemia Panel: No results for input(s): VITAMINB12, FOLATE, FERRITIN, TIBC, IRON, RETICCTPCT in the last 72 hours. Urine analysis:    Component Value Date/Time   COLORURINE YELLOW 03/05/2016 2138   APPEARANCEUR CLEAR 03/05/2016 2138   LABSPEC  1.023 03/05/2016 2138   PHURINE 6.0 03/05/2016 2138   GLUCOSEU NEGATIVE 03/05/2016 2138   HGBUR NEGATIVE 03/05/2016 2138   BILIRUBINUR NEGATIVE 03/05/2016 2138   KETONESUR 15* 03/05/2016 2138   PROTEINUR NEGATIVE 03/05/2016 2138   NITRITE NEGATIVE 03/05/2016 2138   LEUKOCYTESUR NEGATIVE 03/05/2016 2138   Sepsis Labs: No results found for this or any previous visit (from the past 240 hour(s)).   Radiological Exams on Admission: Dg Chest 2 View  03/05/2016  CLINICAL DATA:  Syncopal episode, shortness of breath, diaphoresis, dizziness EXAM: CHEST  2 VIEW COMPARISON:  None available FINDINGS: Postop changes of the right breast and axilla. Normal heart size and vascularity. Lungs remain clear. No focal pneumonia, collapse or consolidation. No edema, effusion or pneumothorax. Trachea is midline.  IMPRESSION: Postop changes as above.  No acute chest process. Electronically Signed   By: Jerilynn Mages.  Shick M.D.   On: 03/05/2016 21:27    EKG: Independently reviewed. Sinus bradycardia with a heart rate of 53  Assessment/Plan Syncope: Acute. Patient with first episode of syncope no previous history of this in the past. Patient notes being drenched in sweat upon wakening with increased work of breathing for about 15-20 minutes. Initial cardiac enzymes revealed a troponin of 0.01 and d-dimer was reassuring at 0.32. - Admit to a telemetry bed - Trend cardiac enzymes - Recheck EKG in a.m. - Check TSH and ESR - IV fluids of normal saline at 75 ml/hour - Follow-up telemetry overnight - Patient would like to rediscuss need of echocardiogram and/or cardiology evaluation in a.m. as she receives most of [facility] from [facility].  Sinus Bradycardia: Patient found to have sinus bradycardia with heart rate of 53 on initial EKG. - Continue to monitor  Transient hypotension: Now resolved. Blood pressures noted to be as low as 80/50s - Continue to monitor   Hypokalemia: Initial potassium 3.2. Given a one-time dose of potassium chloride 40 meq in the ED - Continue to monitor and replace as needed  Remote history of breast cancer. Patient notes having a normal mammogram in January 2017.   DVT prophylaxis: Lovenox Code Status: Full Family Communication:  No family present at bedside Disposition Plan: Likely discharge home in 1-2 days Consults called: None Admission status: Telemetry observation  Norval Morton MD [facility] Pager [phone]  If 7PM-7AM, please contact night-coverage [email or web address] Password [email or web address]  03/05/2016, 10:37 PM

## 2016-03-05 NOTE — ED Notes (Signed)
Claiborne Billings - EMT ambulated patient to restroom, pt tolerated walking well, denies dizziness or lightheadedness.

## 2016-03-06 ENCOUNTER — Observation Stay (HOSPITAL_BASED_OUTPATIENT_CLINIC_OR_DEPARTMENT_OTHER): Payer: Managed Care, Other (non HMO)

## 2016-03-06 ENCOUNTER — Encounter (HOSPITAL_COMMUNITY): Payer: Self-pay

## 2016-03-06 DIAGNOSIS — R55 Syncope and collapse: Secondary | ICD-10-CM

## 2016-03-06 DIAGNOSIS — R001 Bradycardia, unspecified: Secondary | ICD-10-CM | POA: Diagnosis present

## 2016-03-06 DIAGNOSIS — E876 Hypokalemia: Secondary | ICD-10-CM | POA: Diagnosis present

## 2016-03-06 DIAGNOSIS — Z853 Personal history of malignant neoplasm of breast: Secondary | ICD-10-CM

## 2016-03-06 DIAGNOSIS — I959 Hypotension, unspecified: Secondary | ICD-10-CM | POA: Diagnosis present

## 2016-03-06 DIAGNOSIS — R079 Chest pain, unspecified: Secondary | ICD-10-CM | POA: Insufficient documentation

## 2016-03-06 LAB — CBC
HCT: 37.2 % (ref 36.0–46.0)
Hemoglobin: 12.3 g/dL (ref 12.0–15.0)
MCH: 31.1 pg (ref 26.0–34.0)
MCHC: 33.1 g/dL (ref 30.0–36.0)
MCV: 93.9 fL (ref 78.0–100.0)
Platelets: 193 10*3/uL (ref 150–400)
RBC: 3.96 MIL/uL (ref 3.87–5.11)
RDW: 12.8 % (ref 11.5–15.5)
WBC: 5 10*3/uL (ref 4.0–10.5)

## 2016-03-06 LAB — TROPONIN I

## 2016-03-06 LAB — ECHOCARDIOGRAM COMPLETE
Height: 66.5 in
Weight: 2254.4 oz

## 2016-03-06 LAB — BASIC METABOLIC PANEL
ANION GAP: 6 (ref 5–15)
BUN: 11 mg/dL (ref 6–20)
CALCIUM: 9 mg/dL (ref 8.9–10.3)
CO2: 23 mmol/L (ref 22–32)
CREATININE: 0.69 mg/dL (ref 0.44–1.00)
Chloride: 110 mmol/L (ref 101–111)
Glucose, Bld: 102 mg/dL — ABNORMAL HIGH (ref 65–99)
Potassium: 3.9 mmol/L (ref 3.5–5.1)
SODIUM: 139 mmol/L (ref 135–145)

## 2016-03-06 LAB — TSH: TSH: 2.971 u[IU]/mL (ref 0.350–4.500)

## 2016-03-06 LAB — SEDIMENTATION RATE: SED RATE: 20 mm/h (ref 0–22)

## 2016-03-06 MED ORDER — ENOXAPARIN SODIUM 40 MG/0.4ML ~~LOC~~ SOLN
40.0000 mg | SUBCUTANEOUS | Status: DC
  Filled 2016-03-06: qty 0.4

## 2016-03-06 MED ORDER — SODIUM CHLORIDE 0.9% FLUSH: 3.0000 mL | Freq: Two times a day (BID) | INTRAVENOUS | Status: DC

## 2016-03-06 MED ORDER — IPRATROPIUM BROMIDE 0.02 % IN SOLN: 0.5000 mg | RESPIRATORY_TRACT | Status: DC | PRN

## 2016-03-06 MED ORDER — ALBUTEROL SULFATE (2.5 MG/3ML) 0.083% IN NEBU: 2.5000 mg | INHALATION_SOLUTION | RESPIRATORY_TRACT | Status: DC | PRN

## 2016-03-06 MED ORDER — MELATONIN 3 MG PO TABS: 1.0000 | ORAL_TABLET | Freq: Every evening | ORAL | Status: AC | PRN

## 2016-03-06 MED ORDER — ACETAMINOPHEN 650 MG RE SUPP: 650.0000 mg | Freq: Four times a day (QID) | RECTAL | Status: DC | PRN

## 2016-03-06 MED ORDER — SODIUM CHLORIDE 0.9 % IV SOLN
INTRAVENOUS | Status: DC
  Administered 2016-03-06: 01:00:00 via INTRAVENOUS

## 2016-03-06 MED ORDER — ONDANSETRON HCL 4 MG/2ML IJ SOLN: 4.0000 mg | Freq: Four times a day (QID) | INTRAMUSCULAR | Status: DC | PRN

## 2016-03-06 MED ORDER — ONDANSETRON HCL 4 MG PO TABS: 4.0000 mg | ORAL_TABLET | Freq: Four times a day (QID) | ORAL | Status: DC | PRN

## 2016-03-06 MED ORDER — ACETAMINOPHEN 325 MG PO TABS: 650.0000 mg | ORAL_TABLET | Freq: Four times a day (QID) | ORAL | Status: DC | PRN

## 2016-03-06 NOTE — Progress Notes (Signed)
Pt oriented to 2w. Placed on tele. VSS. Pt denies any needs or pain at this time.

## 2016-03-06 NOTE — Progress Notes (Signed)
Echocardiogram 2D Echocardiogram has been performed.  Tresa Res 03/06/2016, 9:40 AM

## 2016-03-06 NOTE — Discharge Summary (Signed)
Physician Discharge Summary   Patient ID: Laura Lester MRN: [id number] DOB/AGE: 18-Mar-1951 65 y.o.  Admit date: 03/05/2016 Discharge date: 03/06/2016  Primary Care Physician:  Rusty Aus, MD  Discharge Diagnoses:     . Syncope likely vaso vagal . Sinus bradycardia . Transient hypotension . Hypokalemia  Consults: none   Recommendations for Outpatient Follow-up:  1. Please repeat CBC/BMET at next visit 2. Patient was taking clonidine at bedtime to sleep as needed however she has borderline lower BP with bradycardia, hence I have discontinued it. I have advised her to avoid clonidine and try melatonin as needed for sleep. If does not work, request her PCP for Ambien or trazodone.   DIET: heart healthy    Allergies:   Allergies  Allergen Reactions  . Penicillins Hives     DISCHARGE MEDICATIONS: Current Discharge Medication List    START taking these medications   Details  Melatonin 3 MG TABS Take 1 tablet (3 mg total) by mouth at bedtime as needed. Available over the counter Qty: 30 tablet, Refills: 0      STOP taking these medications     cloNIDine (CATAPRES) 0.1 MG tablet          Brief H and P: For complete details please refer to admission H and P, but in brief  The patient is a 65 year old female with history of breast cancer status post mastectomy, nephrolithiasis, dysphagia presented with a syncopal episode. Patient was [place] with her [place] when she suddenly felt faint and with blurry vision. She had no seizure-like activity. Patient reported that she had been eating and drinking like normal. No previous cardiac history. Denied any recent long distance trips or prolonged periods of sitting. Upon EMS arrival, patient was noted to have heart rate in 40s with blood pressure of 80/50. Denied any recent sick contacts, chest pain, abdominal pain, nausea, vomiting, diarrhea or dysuria. Patient has been following with GI and ENT regarding  her dysphagia.   Hospital Course:     Syncope likely vasovagal, dehydration - Serial Troponins 3 negative, EKG did not show any ischemia. ESR, TSH normal. - UA showed ketones, patient was placed on gentle hydration. - Patient underwent 2-D echo cardiogram which showed EF of 55-60% with no regional wall motion abnormalities, no valvular stenosis. - Patient is ambulating without any difficulty or dizziness. - Patient was taking clonidine at bedtime to sleep as needed however she has borderline lower BP with bradycardia, hence I have discontinued it. I have advised her to avoid clonidine and try melatonin as needed for sleep. If does not work, request her PCP for Ambien or trazodone.    Sinus bradycardia - Asymptomatic, EKG did not show any heart blocks or ischemia. Not on any rate blocking medications except as needed clonidine which she had not taken recently. 2-D echo showed preserved EF with no regional wall motion abnormalities.    Transient hypotension - Likely due to dehydration, UA showed ketones, patient was gently hydrated.    Hypokalemia -Creatinine 3.2 at the time of admission, replaced     History of breast cancer - Outpatient follow-up  Day of Discharge BP 109/54 mmHg  Pulse 52  Temp(Src) 98.4 F (36.9 C) (Oral)  Resp 19  Ht 5' 6.5" (1.689 m)  Wt 63.912 kg (140 lb 14.4 oz)  BMI 22.40 kg/m2  SpO2 99%  Physical Exam: General: Alert and awake oriented x3 not in any acute distress. HEENT: anicteric sclera, pupils reactive to  light and accommodation CVS: S1-S2 clear no murmur rubs or gallops Chest: clear to auscultation bilaterally, no wheezing rales or rhonchi Abdomen: soft nontender, nondistended, normal bowel sounds Extremities: no cyanosis, clubbing or edema noted bilaterally Neuro: Cranial nerves II-XII intact, no focal neurological deficits   The results of significant diagnostics from this hospitalization (including imaging, microbiology, ancillary and  laboratory) are listed below for reference.    LAB RESULTS: Basic Metabolic Panel:  Recent Labs Lab 03/05/16 1925 03/05/16 2309 03/06/16 0325  NA 140  --  139  K 3.2*  --  3.9  CL 110  --  110  CO2 24  --  23  GLUCOSE 124*  --  102*  BUN 14  --  11  CREATININE 0.88  --  0.69  CALCIUM 9.0  --  9.0  MG  --  1.8  --    Liver Function Tests: No results for input(s): AST, ALT, ALKPHOS, BILITOT, PROT, ALBUMIN in the last 168 hours. No results for input(s): LIPASE, AMYLASE in the last 168 hours. No results for input(s): AMMONIA in the last 168 hours. CBC:  Recent Labs Lab 03/05/16 1925 03/06/16 0325  WBC 4.0 5.0  HGB 13.0 12.3  HCT 39.0 37.2  MCV 93.5 93.9  PLT 186 193   Cardiac Enzymes:  Recent Labs Lab 03/06/16 0325 03/06/16 0515  TROPONINI <0.03 <0.03   BNP: Invalid input(s): POCBNP CBG:  Recent Labs Lab 03/05/16 1927  GLUCAP 124*    Significant Diagnostic Studies:  Dg Chest 2 View  03/05/2016  CLINICAL DATA:  Syncopal episode, shortness of breath, diaphoresis, dizziness EXAM: CHEST  2 VIEW COMPARISON:  None available FINDINGS: Postop changes of the right breast and axilla. Normal heart size and vascularity. Lungs remain clear. No focal pneumonia, collapse or consolidation. No edema, effusion or pneumothorax. Trachea is midline. IMPRESSION: Postop changes as above.  No acute chest process. Electronically Signed   By: Jerilynn Mages.  Shick M.D.   On: 03/05/2016 21:27    2D ECHO:  Study Conclusions  - Left ventricle: The cavity size was normal. Wall thickness was  normal. Systolic function was normal. The estimated ejection  fraction was in the range of 55% to 60%. Wall motion was normal;  there were no regional wall motion abnormalities. - Aortic valve: There was trivial regurgitation. - Ascending aorta: The ascending aorta was mildly dilated. - Mitral valve: There was mild regurgitation. - Left atrium: The atrium was mildly dilated.  Impressions:  -  Normal LV systolic function; probable mild diastolic dysfunction;  mild LAE; trace AI; mild MR. Disposition and Follow-up: Discharge Instructions    Diet - low sodium heart healthy    Complete by:  As directed      Discharge instructions    Complete by:  As directed   Please don't take clonidine to sleep. You can try melatonin over-the-counter, if it doesn't work, please request your PCP for trazodone or Ambien.     Increase activity slowly    Complete by:  As directed             DISPOSITION:Home  DISCHARGE FOLLOW-UP Follow-up Information    Follow up with Rusty Aus, MD. Schedule an appointment as soon as possible for a visit in 2 weeks.   Specialty:  Internal Medicine   Why:  For hospital follow up   Contact information:   [facility] [address] [place] [address] [phone]        Time  spent on Discharge: 25 minutes  Signed:   Kinzley Savell M.D. [facility] 03/06/2016, 11:02 AM Pager: [phone]

## 2016-03-06 NOTE — Progress Notes (Signed)
Discharge Note:  Patient alert and oriented X 4 and in no distress. Patient given discharge instructions regarding signs and symptoms to report, medication, diet, activity, and upcoming appointments.  Patient verbalized understanding of all instructions.  Telemetry and Peripheral IV discontinued. Patient  Confirmed that she had all of her personal belongings.  She was transported out by hospital volunteer.

## 2016-03-13 ENCOUNTER — Other Ambulatory Visit: Payer: Self-pay | Admitting: Internal Medicine

## 2016-03-13 DIAGNOSIS — R55 Syncope and collapse: Secondary | ICD-10-CM

## 2016-03-13 DIAGNOSIS — R0602 Shortness of breath: Secondary | ICD-10-CM

## 2016-03-17 ENCOUNTER — Encounter: Payer: Self-pay | Admitting: Radiology

## 2016-03-17 ENCOUNTER — Ambulatory Visit
Admission: RE | Admit: 2016-03-17 | Discharge: 2016-03-17 | Disposition: A | Discharge status: Home / Self Care | Payer: Managed Care, Other (non HMO) | Source: Ambulatory Visit | Attending: Internal Medicine | Admitting: Internal Medicine

## 2016-03-17 DIAGNOSIS — R55 Syncope and collapse: Secondary | ICD-10-CM | POA: Diagnosis not present

## 2016-03-17 DIAGNOSIS — R0602 Shortness of breath: Secondary | ICD-10-CM | POA: Diagnosis not present

## 2016-03-17 MED ORDER — IOPAMIDOL (ISOVUE-370) INJECTION 76%
75.0000 mL | Freq: Once | INTRAVENOUS | Status: AC | PRN
  Administered 2016-03-17: 75 mL via INTRAVENOUS

## 2016-10-15 IMAGING — CT CT ANGIO CHEST
2 of 6 series · 16 of 36 positions shown · IV contrast (APPLIED)
Comparison: Radiographs March 05, 2016.

CLINICAL DATA: Syncope.

EXAM:
CT ANGIOGRAPHY CHEST WITH CONTRAST
TECHNIQUE: Multidetector CT imaging of the chest was performed using the
standard protocol during bolus administration of intravenous
contrast. Multiplanar CT image reconstructions and MIPs were
obtained to evaluate the vascular anatomy.
CONTRAST:  75 mL of Isovue 370 intravenously.

[Series 5: thins · axial · 0.64mm/px · z∈[-598,-328]mm · 15 of 259 slices shown]
[im 17/259  lung]
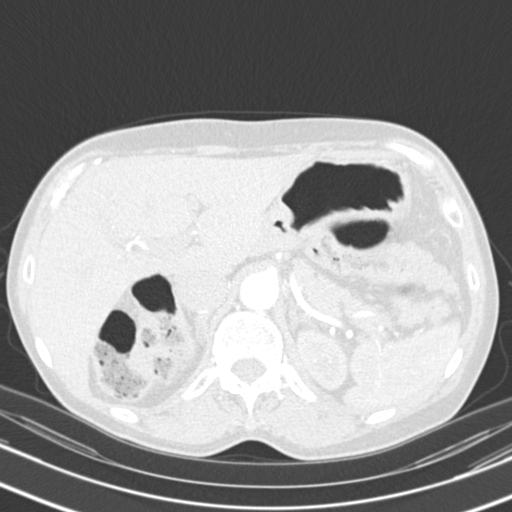
[im 33/259  mediastinal]
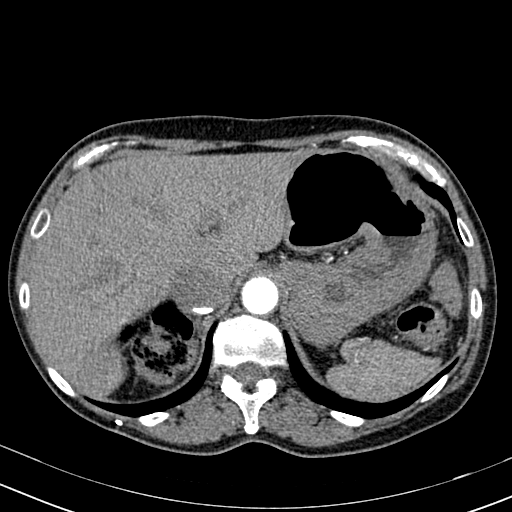
[im 49/259  lung]
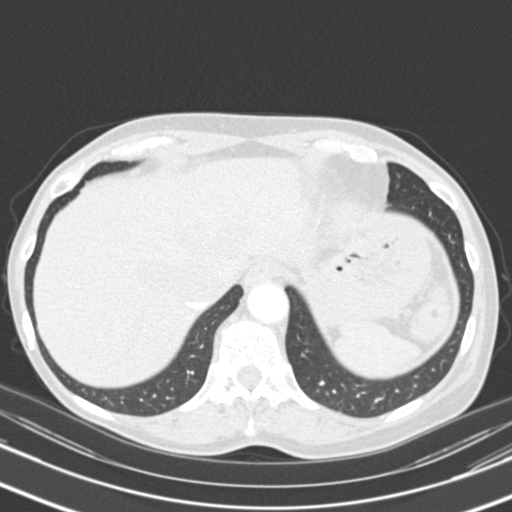
[im 65/259  mediastinal]
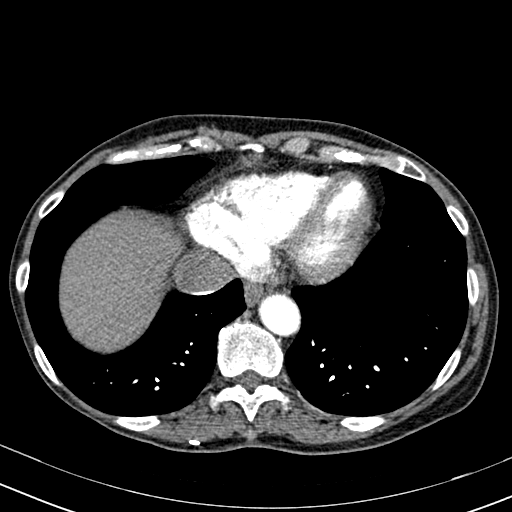
[im 81/259  lung]
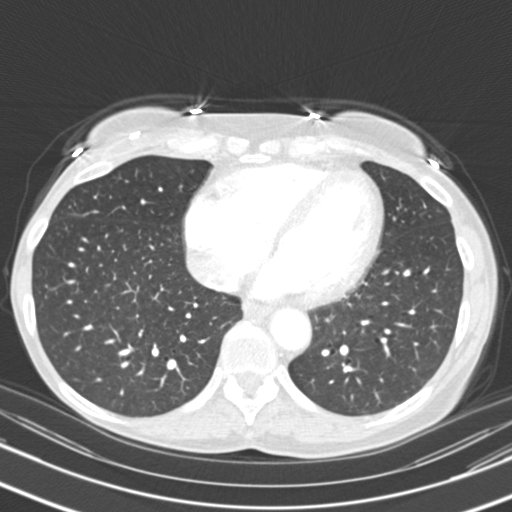
[im 97/259  mediastinal]
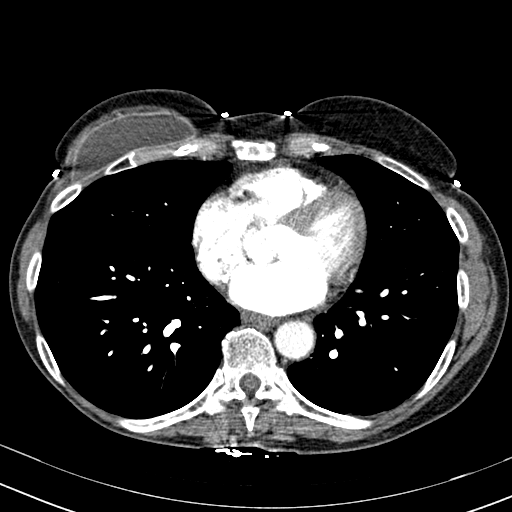
[im 113/259  lung]
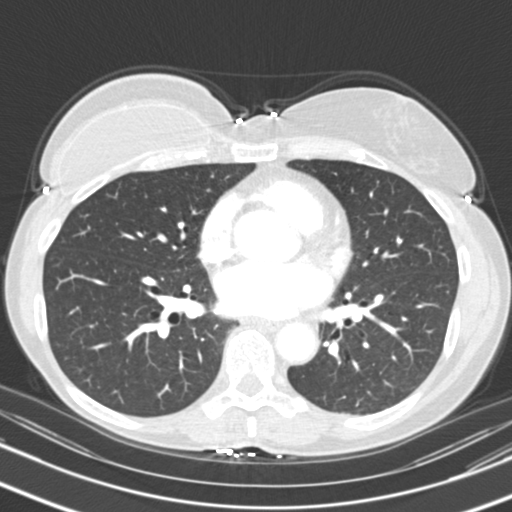
[im 130/259  mediastinal]
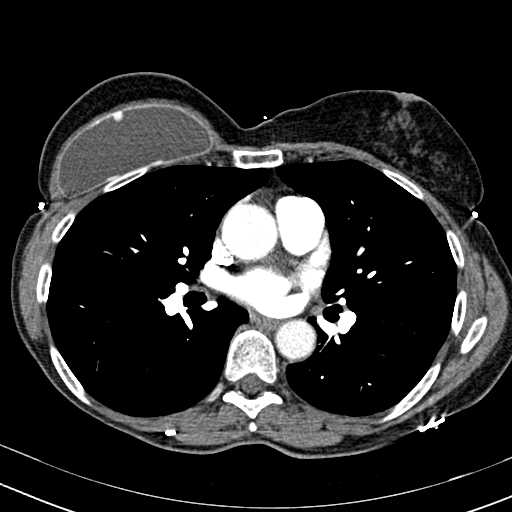
[im 146/259  lung]
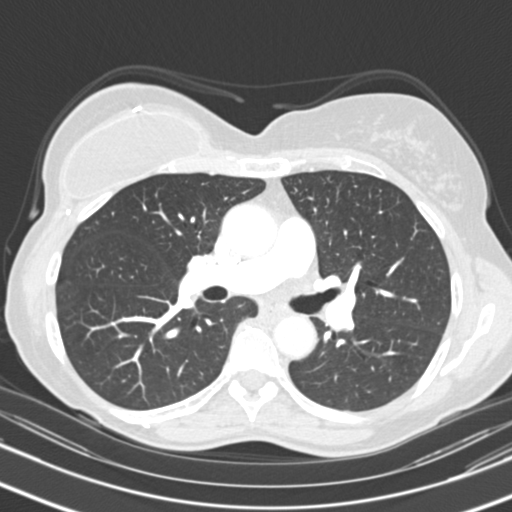
[im 162/259  mediastinal]
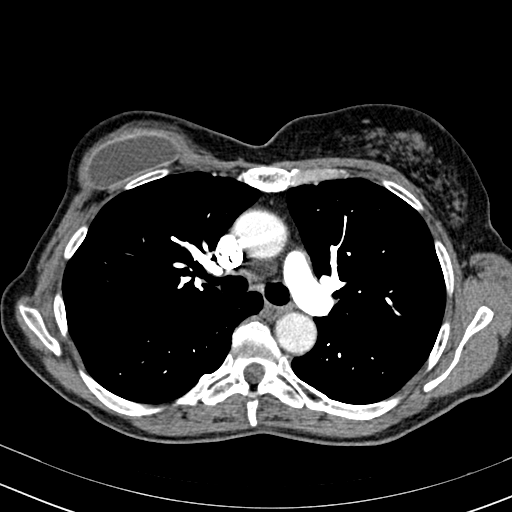
[im 178/259  lung]
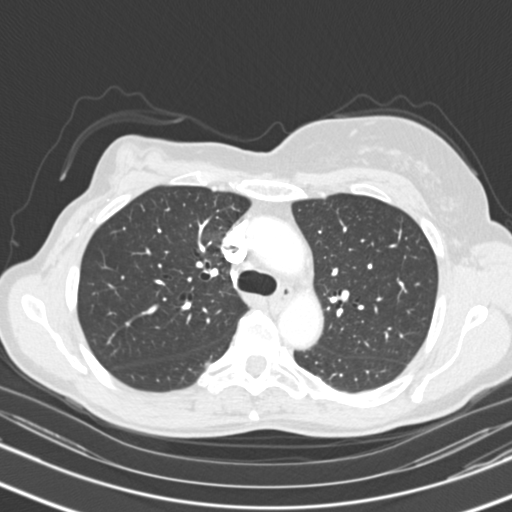
[im 194/259  mediastinal]
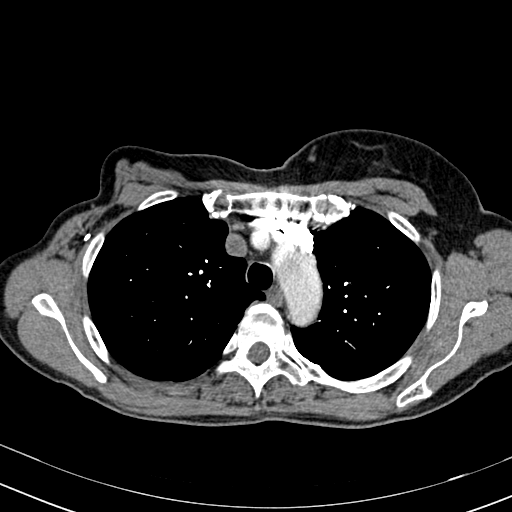
[im 210/259  lung]
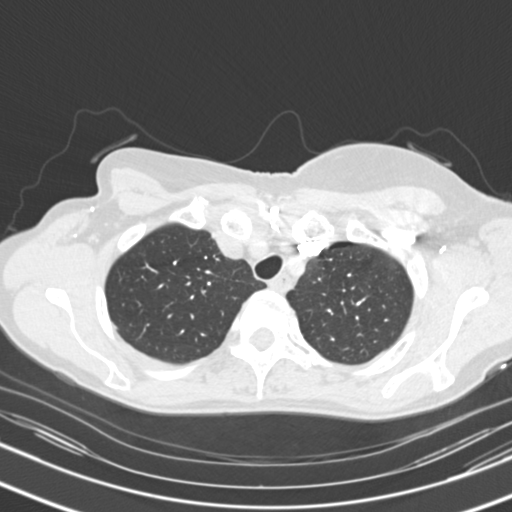
[im 226/259  mediastinal]
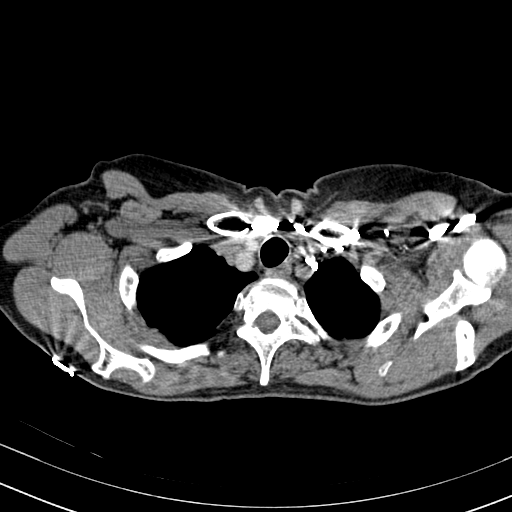
[im 242/259  lung]
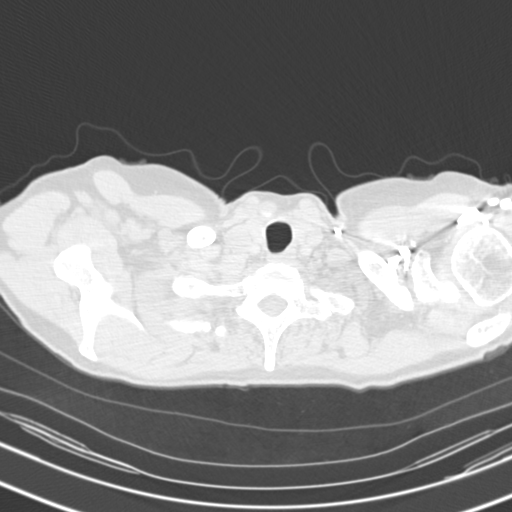

[Series 602: coronal mpr · coronal · 0.64mm/px · 1 of 123 slices shown]
[im 62/123  mediastinal]
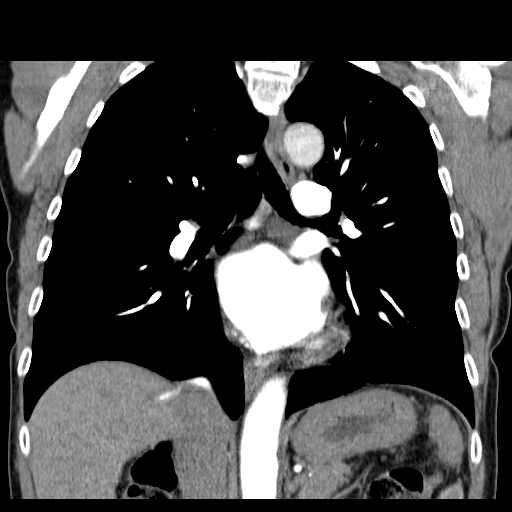

[16 of 36 positions shown; findings below may reference images not displayed]

FINDINGS: No pneumothorax or pleural effusion is noted. No acute pulmonary
disease is noted. There is no evidence of pulmonary embolus. There
is no evidence of thoracic aortic dissection or aneurysm. Visualized
portion of upper abdomen is unremarkable. No significant osseous
abnormality is noted.

Review of the MIP images confirms the above findings.
IMPRESSION: No evidence pulmonary embolus. No acute cardiopulmonary abnormality
seen.
